# Patient Record
Sex: Male | Born: 1965 | Race: Black or African American | Hispanic: No | Marital: Single | Smoking: Current every day smoker
Health system: Southern US, Community
[De-identification: ages and names within clinical notes are randomized; demographics above are authoritative.]

## PROBLEM LIST (undated history)

## (undated) DIAGNOSIS — E7800 Pure hypercholesterolemia, unspecified: Secondary | ICD-10-CM

## (undated) DIAGNOSIS — J45909 Unspecified asthma, uncomplicated: Secondary | ICD-10-CM

## (undated) DIAGNOSIS — W3400XA Accidental discharge from unspecified firearms or gun, initial encounter: Secondary | ICD-10-CM

## (undated) DIAGNOSIS — E785 Hyperlipidemia, unspecified: Secondary | ICD-10-CM

## (undated) DIAGNOSIS — S21139A Puncture wound without foreign body of unspecified front wall of thorax without penetration into thoracic cavity, initial encounter: Secondary | ICD-10-CM

## (undated) HISTORY — PX: FRACTURE SURGERY: SHX138

## (undated) HISTORY — PX: MANDIBLE SURGERY: SHX707

---

## 2011-01-04 ENCOUNTER — Emergency Department (HOSPITAL_COMMUNITY): Payer: No Typology Code available for payment source

## 2011-01-04 ENCOUNTER — Emergency Department (HOSPITAL_COMMUNITY)
Admission: EM | Admit: 2011-01-04 | Discharge: 2011-01-05 | Disposition: A | Discharge status: Home / Self Care | Payer: No Typology Code available for payment source | Attending: Emergency Medicine | Admitting: Emergency Medicine

## 2011-01-04 DIAGNOSIS — S60229A Contusion of unspecified hand, initial encounter: Secondary | ICD-10-CM | POA: Insufficient documentation

## 2011-01-04 DIAGNOSIS — M79609 Pain in unspecified limb: Secondary | ICD-10-CM | POA: Insufficient documentation

## 2011-01-04 DIAGNOSIS — M7989 Other specified soft tissue disorders: Secondary | ICD-10-CM | POA: Insufficient documentation

## 2011-01-10 ENCOUNTER — Emergency Department (HOSPITAL_COMMUNITY): Payer: No Typology Code available for payment source

## 2011-01-10 ENCOUNTER — Emergency Department (HOSPITAL_COMMUNITY)
Admission: EM | Admit: 2011-01-10 | Discharge: 2011-01-10 | Disposition: A | Discharge status: Home / Self Care | Payer: No Typology Code available for payment source | Attending: Emergency Medicine | Admitting: Emergency Medicine

## 2011-01-10 DIAGNOSIS — Z09 Encounter for follow-up examination after completed treatment for conditions other than malignant neoplasm: Secondary | ICD-10-CM | POA: Insufficient documentation

## 2011-01-10 DIAGNOSIS — S60229A Contusion of unspecified hand, initial encounter: Secondary | ICD-10-CM | POA: Insufficient documentation

## 2011-01-10 DIAGNOSIS — M79609 Pain in unspecified limb: Secondary | ICD-10-CM | POA: Insufficient documentation

## 2011-01-10 DIAGNOSIS — M7989 Other specified soft tissue disorders: Secondary | ICD-10-CM | POA: Insufficient documentation

## 2011-01-10 DIAGNOSIS — S6990XA Unspecified injury of unspecified wrist, hand and finger(s), initial encounter: Secondary | ICD-10-CM | POA: Insufficient documentation

## 2011-05-09 ENCOUNTER — Encounter: Payer: Self-pay | Admitting: *Deleted

## 2011-05-09 ENCOUNTER — Emergency Department (HOSPITAL_COMMUNITY)
Admission: EM | Admit: 2011-05-09 | Discharge: 2011-05-10 | Disposition: A | Discharge status: Home / Self Care | Payer: Self-pay | Attending: Emergency Medicine | Admitting: Emergency Medicine

## 2011-05-09 DIAGNOSIS — J45901 Unspecified asthma with (acute) exacerbation: Secondary | ICD-10-CM | POA: Insufficient documentation

## 2011-05-09 DIAGNOSIS — R0789 Other chest pain: Secondary | ICD-10-CM | POA: Insufficient documentation

## 2011-05-09 DIAGNOSIS — R05 Cough: Secondary | ICD-10-CM | POA: Insufficient documentation

## 2011-05-09 DIAGNOSIS — R059 Cough, unspecified: Secondary | ICD-10-CM | POA: Insufficient documentation

## 2011-05-09 DIAGNOSIS — Z79899 Other long term (current) drug therapy: Secondary | ICD-10-CM | POA: Insufficient documentation

## 2011-05-09 DIAGNOSIS — E7800 Pure hypercholesterolemia, unspecified: Secondary | ICD-10-CM | POA: Insufficient documentation

## 2011-05-09 HISTORY — DX: Pure hypercholesterolemia, unspecified: E78.00

## 2011-05-09 HISTORY — DX: Accidental discharge from unspecified firearms or gun, initial encounter: W34.00XA

## 2011-05-09 HISTORY — DX: Puncture wound without foreign body of unspecified front wall of thorax without penetration into thoracic cavity, initial encounter: S21.139A

## 2011-05-09 MED ORDER — ALBUTEROL SULFATE (5 MG/ML) 0.5% IN NEBU
5.0000 mg | INHALATION_SOLUTION | Freq: Once | RESPIRATORY_TRACT | Status: AC
  Administered 2011-05-09: 5 mg via RESPIRATORY_TRACT
  Filled 2011-05-09: qty 1
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
## 2011-05-09 NOTE — ED Notes (Signed)
C/o cough, sob & wheezing. R/t asthma. Guarded resps & speech, easily provoked cough. LS inspiritory & expiritory wheezing. Productive with clear phlegm. Denies fever.

## 2011-05-10 ENCOUNTER — Emergency Department (HOSPITAL_COMMUNITY): Payer: Self-pay

## 2011-05-10 MED ORDER — ALBUTEROL SULFATE HFA 108 (90 BASE) MCG/ACT IN AERS
2.0000 | INHALATION_SPRAY | Freq: Once | RESPIRATORY_TRACT | Status: AC
  Administered 2011-05-10: 2 via RESPIRATORY_TRACT
  Filled 2011-05-10: qty 6.7

## 2011-05-10 MED ORDER — ALBUTEROL SULFATE HFA 108 (90 BASE) MCG/ACT IN AERS: 2.0000 | INHALATION_SPRAY | RESPIRATORY_TRACT | Status: DC | PRN

## 2011-05-10 MED ORDER — PREDNISONE 20 MG PO TABS: 20.0000 mg | ORAL_TABLET | Freq: Every day | ORAL | Status: AC
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
## 2011-08-08 ENCOUNTER — Emergency Department (HOSPITAL_COMMUNITY)
Admission: EM | Admit: 2011-08-08 | Discharge: 2011-08-08 | Disposition: A | Discharge status: Home / Self Care | Payer: Self-pay | Attending: Emergency Medicine | Admitting: Emergency Medicine

## 2011-08-08 ENCOUNTER — Encounter (HOSPITAL_COMMUNITY): Payer: Self-pay | Admitting: *Deleted

## 2011-08-08 DIAGNOSIS — IMO0002 Reserved for concepts with insufficient information to code with codable children: Secondary | ICD-10-CM | POA: Insufficient documentation

## 2011-08-08 DIAGNOSIS — F17200 Nicotine dependence, unspecified, uncomplicated: Secondary | ICD-10-CM | POA: Insufficient documentation

## 2011-08-08 DIAGNOSIS — Z79899 Other long term (current) drug therapy: Secondary | ICD-10-CM | POA: Insufficient documentation

## 2011-08-08 DIAGNOSIS — J45909 Unspecified asthma, uncomplicated: Secondary | ICD-10-CM | POA: Insufficient documentation

## 2011-08-08 DIAGNOSIS — M79609 Pain in unspecified limb: Secondary | ICD-10-CM | POA: Insufficient documentation

## 2011-08-08 DIAGNOSIS — E7800 Pure hypercholesterolemia, unspecified: Secondary | ICD-10-CM | POA: Insufficient documentation

## 2011-08-08 DIAGNOSIS — M7989 Other specified soft tissue disorders: Secondary | ICD-10-CM | POA: Insufficient documentation

## 2011-08-08 DIAGNOSIS — R609 Edema, unspecified: Secondary | ICD-10-CM | POA: Insufficient documentation

## 2011-08-08 MED ORDER — SULFAMETHOXAZOLE-TRIMETHOPRIM 800-160 MG PO TABS: 1.0000 | ORAL_TABLET | Freq: Two times a day (BID) | ORAL | Status: AC

## 2011-08-08 MED ORDER — HYDROCODONE-ACETAMINOPHEN 5-325 MG PO TABS: 1.0000 | ORAL_TABLET | ORAL | Status: AC | PRN

## 2011-08-08 NOTE — ED Notes (Signed)
Rt thumb nail infected he bites his fingernails and i has been painful and swollen for one week

## 2011-08-08 NOTE — ED Notes (Signed)
Patient rates pain 5/10; describes pain as "throbbing".

## 2011-08-08 NOTE — ED Notes (Addendum)
Patient complaining of infected right thumb nail for the past four days; patient states that he constantly bites his nails at work and that he has had hangnails in the past and he has soaked them in water to help with his pain.  Patient is worried that staff is going to cut his thumb off and keeps asking, "Are you going to cut my thumb? I need my hand for work and to pay rent".  Patient's right thumb swollen; white boil like area noted.  Girlfriend brought to bedside, per patient request.  Patient alert and oriented x4; PERRL present. Will continue to monitor.
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
## 2011-08-08 NOTE — ED Notes (Signed)
Patient given discharge paperwork; went over discharge instructions with patient.  Patient instructed to take Septra and Norco as directed; instructed to finish entire antibiotic prescription and to not drive while taking Norco.  Patient instructed to follow up with primary care physician and to return to the ED for new, worsening, or concerning symptoms.

## 2011-08-08 NOTE — ED Notes (Signed)
Patient currently sitting up in bed; no respiratory or acute distress noted.  Patient updated on plan of care; girlfriend at bedside.  Will continue to monitor.

## 2011-08-08 NOTE — ED Notes (Signed)
PA at bedside.

## 2011-08-09 NOTE — ED Provider Notes (Signed)
Medical screening examination/treatment/procedure(s) were performed by non-physician practitioner and as supervising physician I was immediately available for consultation/collaboration.  Ladene Allocca R. Noland Pizano, MD 08/09/11 2304 

## 2012-05-23 ENCOUNTER — Encounter (HOSPITAL_COMMUNITY): Payer: Self-pay

## 2012-05-23 ENCOUNTER — Emergency Department (HOSPITAL_COMMUNITY)
Admission: EM | Admit: 2012-05-23 | Discharge: 2012-05-23 | Disposition: A | Discharge status: Home / Self Care | Payer: Self-pay | Attending: Emergency Medicine | Admitting: Emergency Medicine

## 2012-05-23 DIAGNOSIS — Z202 Contact with and (suspected) exposure to infections with a predominantly sexual mode of transmission: Secondary | ICD-10-CM | POA: Insufficient documentation

## 2012-05-23 DIAGNOSIS — Z8709 Personal history of other diseases of the respiratory system: Secondary | ICD-10-CM | POA: Insufficient documentation

## 2012-05-23 DIAGNOSIS — J45909 Unspecified asthma, uncomplicated: Secondary | ICD-10-CM | POA: Insufficient documentation

## 2012-05-23 DIAGNOSIS — Z79899 Other long term (current) drug therapy: Secondary | ICD-10-CM | POA: Insufficient documentation

## 2012-05-23 DIAGNOSIS — F17200 Nicotine dependence, unspecified, uncomplicated: Secondary | ICD-10-CM | POA: Insufficient documentation

## 2012-05-23 DIAGNOSIS — E7800 Pure hypercholesterolemia, unspecified: Secondary | ICD-10-CM | POA: Insufficient documentation

## 2012-05-23 DIAGNOSIS — Z87828 Personal history of other (healed) physical injury and trauma: Secondary | ICD-10-CM | POA: Insufficient documentation

## 2012-05-23 MED ORDER — CEFTRIAXONE SODIUM 250 MG IJ SOLR
250.0000 mg | Freq: Once | INTRAMUSCULAR | Status: AC
  Administered 2012-05-23: 250 mg via INTRAMUSCULAR
  Filled 2012-05-23: qty 250

## 2012-05-23 MED ORDER — AZITHROMYCIN 250 MG PO TABS
1000.0000 mg | ORAL_TABLET | Freq: Once | ORAL | Status: AC
  Administered 2012-05-23: 1000 mg via ORAL
  Filled 2012-05-23: qty 4

## 2012-05-23 MED ORDER — CEFTRIAXONE SODIUM 1 G IJ SOLR: 1.0000 g | Freq: Once | INTRAMUSCULAR | Status: DC

## 2012-05-23 MED ORDER — LIDOCAINE HCL (PF) 1 % IJ SOLN
INTRAMUSCULAR | Status: AC
  Administered 2012-05-23: 0.9 mL
  Filled 2012-05-23: qty 5

## 2012-05-23 NOTE — ED Provider Notes (Signed)
Medical screening examination/treatment/procedure(s) were performed by non-physician practitioner and as supervising physician I was immediately available for consultation/collaboration.  Juliet Rude. Rubin Payor, MD 05/23/12 [phone]
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
## 2014-09-20 ENCOUNTER — Encounter (HOSPITAL_COMMUNITY): Payer: Self-pay | Admitting: *Deleted

## 2014-09-20 ENCOUNTER — Emergency Department (HOSPITAL_COMMUNITY)
Admission: EM | Admit: 2014-09-20 | Discharge: 2014-09-20 | Disposition: A | Discharge status: Home / Self Care | Payer: Self-pay | Attending: Emergency Medicine | Admitting: Emergency Medicine

## 2014-09-20 ENCOUNTER — Emergency Department (HOSPITAL_COMMUNITY): Payer: Self-pay

## 2014-09-20 DIAGNOSIS — E780 Pure hypercholesterolemia: Secondary | ICD-10-CM | POA: Insufficient documentation

## 2014-09-20 DIAGNOSIS — S62619A Displaced fracture of proximal phalanx of unspecified finger, initial encounter for closed fracture: Secondary | ICD-10-CM

## 2014-09-20 DIAGNOSIS — Z79899 Other long term (current) drug therapy: Secondary | ICD-10-CM | POA: Insufficient documentation

## 2014-09-20 DIAGNOSIS — Y998 Other external cause status: Secondary | ICD-10-CM | POA: Insufficient documentation

## 2014-09-20 DIAGNOSIS — S0081XA Abrasion of other part of head, initial encounter: Secondary | ICD-10-CM | POA: Insufficient documentation

## 2014-09-20 DIAGNOSIS — S62623A Displaced fracture of medial phalanx of left middle finger, initial encounter for closed fracture: Secondary | ICD-10-CM | POA: Insufficient documentation

## 2014-09-20 DIAGNOSIS — Z23 Encounter for immunization: Secondary | ICD-10-CM | POA: Insufficient documentation

## 2014-09-20 DIAGNOSIS — Y9389 Activity, other specified: Secondary | ICD-10-CM | POA: Insufficient documentation

## 2014-09-20 DIAGNOSIS — J45909 Unspecified asthma, uncomplicated: Secondary | ICD-10-CM | POA: Insufficient documentation

## 2014-09-20 DIAGNOSIS — Y92410 Unspecified street and highway as the place of occurrence of the external cause: Secondary | ICD-10-CM | POA: Insufficient documentation

## 2014-09-20 DIAGNOSIS — Z720 Tobacco use: Secondary | ICD-10-CM | POA: Insufficient documentation

## 2014-09-20 MED ORDER — IBUPROFEN 400 MG PO TABS
600.0000 mg | ORAL_TABLET | Freq: Once | ORAL | Status: AC
  Administered 2014-09-20: 600 mg via ORAL
  Filled 2014-09-20 (×2): qty 1

## 2014-09-20 MED ORDER — TETANUS-DIPHTH-ACELL PERTUSSIS 5-2.5-18.5 LF-MCG/0.5 IM SUSP
0.5000 mL | Freq: Once | INTRAMUSCULAR | Status: AC
  Administered 2014-09-20: 0.5 mL via INTRAMUSCULAR
  Filled 2014-09-20: qty 0.5

## 2014-09-20 NOTE — Progress Notes (Signed)
Orthopedic Tech Progress Note Patient Details:  Rickey Kelly 1966-03-25 [id number] Applied fiberglass short arm splint to palmer side of LUE.  Extended splint up palm to pip joint to immobilize third finger, per verbal order of ED MD.  Positioned hand and fingers in position of use.  Pulses, sensation, motion intact before and after splinting.  Capillary refill less than 2 seconds before and after splinting. Ortho Devices Type of Ortho Device: Other (comment) Ortho Device/Splint Location: Palmer-sided short arm splint, LUE.   Lesle ChrisGilliland, Makaelyn Aponte L 09/20/2014, 9:45 PM

## 2014-09-20 NOTE — ED Notes (Signed)
Ortho paged. 
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
## 2014-09-20 NOTE — Discharge Instructions (Signed)
Cast or Splint Care °Casts and splints support injured limbs and keep bones from moving while they heal. It is important to care for your cast or splint at home.   °HOME CARE INSTRUCTIONS °· Keep the cast or splint uncovered during the drying period. It can take 24 to 48 hours to dry if it is made of plaster. A fiberglass cast will dry in less than 1 hour. °· Do not rest the cast on anything harder than a pillow for the first 24 hours. °· Do not put weight on your injured limb or apply pressure to the cast until your health care provider gives you permission. °· Keep the cast or splint dry. Wet casts or splints can lose their shape and may not support the limb as well. A wet cast that has lost its shape can also create harmful pressure on your skin when it dries. Also, wet skin can become infected. °· Cover the cast or splint with a plastic bag when bathing or when out in the rain or snow. If the cast is on the trunk of the body, take sponge baths until the cast is removed. °· If your cast does become wet, dry it with a towel or a blow dryer on the cool setting only. °· Keep your cast or splint clean. Soiled casts may be wiped with a moistened cloth. °· Do not place any hard or soft foreign objects under your cast or splint, such as cotton, toilet paper, lotion, or powder. °· Do not try to scratch the skin under the cast with any object. The object could get stuck inside the cast. Also, scratching could lead to an infection. If itching is a problem, use a blow dryer on a cool setting to relieve discomfort. °· Do not trim or cut your cast or remove padding from inside of it. °· Exercise all joints next to the injury that are not immobilized by the cast or splint. For example, if you have a long leg cast, exercise the hip joint and toes. If you have an arm cast or splint, exercise the shoulder, elbow, thumb, and fingers. °· Elevate your injured arm or leg on 1 or 2 pillows for the first 1 to 3 days to decrease  swelling and pain. It is best if you can comfortably elevate your cast so it is higher than your heart. °SEEK MEDICAL CARE IF:  °· Your cast or splint cracks. °· Your cast or splint is too tight or too loose. °· You have unbearable itching inside the cast. °· Your cast becomes wet or develops a soft spot or area. °· You have a bad smell coming from inside your cast. °· You get an object stuck under your cast. °· Your skin around the cast becomes red or raw. °· You have new pain or worsening pain after the cast has been applied. °SEEK IMMEDIATE MEDICAL CARE IF:  °· You have fluid leaking through the cast. °· You are unable to move your fingers or toes. °· You have discolored (blue or white), cool, painful, or very swollen fingers or toes beyond the cast. °· You have tingling or numbness around the injured area. °· You have severe pain or pressure under the cast. °· You have any difficulty with your breathing or have shortness of breath. °· You have chest pain. °Document Released: 04/15/2000 Document Revised: 02/06/2013 Document Reviewed: 10/25/2012 °Exit[employer]® Patient Information ©2015 [employer]. This information is not intended to replace advice given to you by your health care   provider. Make sure you discuss any questions you have with your health care provider.  Motor Vehicle Collision It is common to have multiple bruises and sore muscles after a motor vehicle collision (MVC). These tend to feel worse for the first 24 hours. You may have the most stiffness and soreness over the first several hours. You may also feel worse when you wake up the first morning after your collision. After this point, you will usually begin to improve with each day. The speed of improvement often depends on the severity of the collision, the number of injuries, and the location and nature of these injuries. HOME CARE INSTRUCTIONS  Put ice on the injured area.  Put ice in a plastic bag.  Place a towel between your skin and  the bag.  Leave the ice on for 15-20 minutes, 3-4 times a day, or as directed by your health care provider.  Drink enough fluids to keep your urine clear or pale yellow. Do not drink alcohol.  Take a warm shower or bath once or twice a day. This will increase blood flow to sore muscles.  You may return to activities as directed by your caregiver. Be careful when lifting, as this may aggravate neck or back pain.  Only take over-the-counter or prescription medicines for pain, discomfort, or fever as directed by your caregiver. Do not use aspirin. This may increase bruising and bleeding. SEEK IMMEDIATE MEDICAL CARE IF:  You have numbness, tingling, or weakness in the arms or legs.  You develop severe headaches not relieved with medicine.  You have severe neck pain, especially tenderness in the middle of the back of your neck.  You have changes in bowel or bladder control.  There is increasing pain in any area of the body.  You have shortness of breath, light-headedness, dizziness, or fainting.  You have chest pain.  You feel sick to your stomach (nauseous), throw up (vomit), or sweat.  You have increasing abdominal discomfort.  There is blood in your urine, stool, or vomit.  You have pain in your shoulder (shoulder strap areas).  You feel your symptoms are getting worse. MAKE SURE YOU:  Understand these instructions.  Will watch your condition.  Will get help right away if you are not doing well or get worse. Document Released: 04/18/2005 Document Revised: 09/02/2013 Document Reviewed: 09/15/2010 [facility] Patient Information 2015 [place], [place]. This information is not intended to replace advice given to you by your health care provider. Make sure you discuss any questions you have with your health care provider.  Finger Fracture Fractures of fingers are breaks in the bones of the fingers. There are many types of fractures. There are different ways of treating these  fractures. Your health care provider will discuss the best way to treat your fracture. CAUSES Traumatic injury is the main cause of broken fingers. These include:  Injuries while playing sports.  Workplace injuries.  Falls. RISK FACTORS Activities that can increase your risk of finger fractures include:  Sports.  Workplace activities that involve machinery.  A condition called osteoporosis, which can make your bones less dense and cause them to fracture more easily. SIGNS AND SYMPTOMS The main symptoms of a broken finger are pain and swelling within 15 minutes after the injury. Other symptoms include:  Bruising of your finger.  Stiffness of your finger.  Numbness of your finger.  Exposed bones (compound fracture) if the fracture is severe. DIAGNOSIS  The best way to diagnose a broken bone is  with X-ray imaging. Additionally, your health care provider will use this X-ray image to evaluate the position of the broken finger bones.  TREATMENT  Finger fractures can be treated with:   Nonreduction--This means the bones are in place. The finger is splinted without changing the positions of the bone pieces. The splint is usually left on for about a week to 10 days. This will depend on your fracture and what your health care provider thinks.  Closed reduction--The bones are put back into position without using surgery. The finger is then splinted.  Open reduction and internal fixation--The fracture site is opened. Then the bone pieces are fixed into place with pins or some type of hardware. This is seldom required. It depends on the severity of the fracture. HOME CARE INSTRUCTIONS   Follow your health care provider's instructions regarding activities, exercises, and physical therapy.  Only take over-the-counter or prescription medicines for pain, discomfort, or fever as directed by your health care provider. SEEK MEDICAL CARE IF: You have pain or swelling that limits the motion or  use of your fingers. SEEK IMMEDIATE MEDICAL CARE IF:  Your finger becomes numb. MAKE SURE YOU:   Understand these instructions.  Will watch your condition.  Will get help right away if you are not doing well or get worse. Document Released: 07/31/2000 Document Revised: 02/06/2013 Document Reviewed: 11/28/2012 [facility] Patient Information 2015 [place]. This information is not intended to replace advice given to you by your health care provider. Make sure you discuss any questions you have with your health care provider.
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
## 2014-10-15 ENCOUNTER — Emergency Department (HOSPITAL_COMMUNITY)
Admission: EM | Admit: 2014-10-15 | Discharge: 2014-10-15 | Disposition: A | Discharge status: Home / Self Care | Payer: Self-pay | Attending: Emergency Medicine | Admitting: Emergency Medicine

## 2014-10-15 ENCOUNTER — Emergency Department (HOSPITAL_COMMUNITY): Payer: Self-pay

## 2014-10-15 ENCOUNTER — Encounter (HOSPITAL_COMMUNITY): Payer: Self-pay | Admitting: Family Medicine

## 2014-10-15 DIAGNOSIS — E780 Pure hypercholesterolemia: Secondary | ICD-10-CM | POA: Insufficient documentation

## 2014-10-15 DIAGNOSIS — Z79899 Other long term (current) drug therapy: Secondary | ICD-10-CM | POA: Insufficient documentation

## 2014-10-15 DIAGNOSIS — S62308P Unspecified fracture of other metacarpal bone, subsequent encounter for fracture with malunion: Secondary | ICD-10-CM

## 2014-10-15 DIAGNOSIS — R200 Anesthesia of skin: Secondary | ICD-10-CM | POA: Insufficient documentation

## 2014-10-15 DIAGNOSIS — J45909 Unspecified asthma, uncomplicated: Secondary | ICD-10-CM | POA: Insufficient documentation

## 2014-10-15 DIAGNOSIS — Z720 Tobacco use: Secondary | ICD-10-CM | POA: Insufficient documentation

## 2014-10-15 DIAGNOSIS — S62303D Unspecified fracture of third metacarpal bone, left hand, subsequent encounter for fracture with routine healing: Secondary | ICD-10-CM | POA: Insufficient documentation

## 2014-10-15 MED ORDER — HYDROCODONE-ACETAMINOPHEN 5-325 MG PO TABS: 2.0000 | ORAL_TABLET | ORAL | Status: DC | PRN

## 2014-10-15 NOTE — ED Notes (Signed)
Ortho called and returned page.

## 2014-10-15 NOTE — Discharge Instructions (Signed)
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
## 2014-10-15 NOTE — Progress Notes (Signed)
Orthopedic Tech Progress Note Patient Details:  Rickey Kelly June 12, 1965 [id number]  Ortho Devices Type of Ortho Device: Ace wrap, Finger splint Ortho Device/Splint Location: lue Ortho Device/Splint Interventions: Application   Gardiner Espana 10/15/2014, 12:29 PM

## 2015-02-15 ENCOUNTER — Emergency Department (HOSPITAL_COMMUNITY)
Admission: EM | Admit: 2015-02-15 | Discharge: 2015-02-15 | Disposition: A | Discharge status: Home / Self Care | Payer: Self-pay | Attending: Emergency Medicine | Admitting: Emergency Medicine

## 2015-02-15 ENCOUNTER — Encounter (HOSPITAL_COMMUNITY): Payer: Self-pay | Admitting: Emergency Medicine

## 2015-02-15 ENCOUNTER — Emergency Department (HOSPITAL_COMMUNITY): Payer: Self-pay

## 2015-02-15 DIAGNOSIS — Z720 Tobacco use: Secondary | ICD-10-CM | POA: Insufficient documentation

## 2015-02-15 DIAGNOSIS — Z8639 Personal history of other endocrine, nutritional and metabolic disease: Secondary | ICD-10-CM | POA: Insufficient documentation

## 2015-02-15 DIAGNOSIS — Z87828 Personal history of other (healed) physical injury and trauma: Secondary | ICD-10-CM | POA: Insufficient documentation

## 2015-02-15 DIAGNOSIS — J45901 Unspecified asthma with (acute) exacerbation: Secondary | ICD-10-CM | POA: Insufficient documentation

## 2015-02-15 LAB — CBC
HCT: 46.2 % (ref 39.0–52.0)
HEMOGLOBIN: 16 g/dL (ref 13.0–17.0)
MCH: 34.6 pg — AB (ref 26.0–34.0)
MCHC: 34.6 g/dL (ref 30.0–36.0)
MCV: 100 fL (ref 78.0–100.0)
PLATELETS: 274 10*3/uL (ref 150–400)
RBC: 4.62 MIL/uL (ref 4.22–5.81)
RDW: 13 % (ref 11.5–15.5)
WBC: 5.6 10*3/uL (ref 4.0–10.5)

## 2015-02-15 LAB — I-STAT TROPONIN, ED: TROPONIN I, POC: 0 ng/mL (ref 0.00–0.08)

## 2015-02-15 LAB — BASIC METABOLIC PANEL
ANION GAP: 10 (ref 5–15)
BUN: 15 mg/dL (ref 6–20)
CALCIUM: 9.8 mg/dL (ref 8.9–10.3)
CO2: 28 mmol/L (ref 22–32)
CREATININE: 0.99 mg/dL (ref 0.61–1.24)
Chloride: 102 mmol/L (ref 101–111)
Glucose, Bld: 141 mg/dL — ABNORMAL HIGH (ref 65–99)
Potassium: 4.4 mmol/L (ref 3.5–5.1)
SODIUM: 140 mmol/L (ref 135–145)

## 2015-02-15 MED ORDER — ALBUTEROL SULFATE (2.5 MG/3ML) 0.083% IN NEBU
INHALATION_SOLUTION | RESPIRATORY_TRACT | Status: AC
  Filled 2015-02-15: qty 6

## 2015-02-15 MED ORDER — ALBUTEROL SULFATE HFA 108 (90 BASE) MCG/ACT IN AERS: 2.0000 | INHALATION_SPRAY | Freq: Four times a day (QID) | RESPIRATORY_TRACT | Status: DC | PRN

## 2015-02-15 MED ORDER — PREDNISONE 20 MG PO TABS: 40.0000 mg | ORAL_TABLET | Freq: Every day | ORAL | Status: DC

## 2015-02-15 MED ORDER — ALBUTEROL SULFATE (2.5 MG/3ML) 0.083% IN NEBU
5.0000 mg | INHALATION_SOLUTION | Freq: Once | RESPIRATORY_TRACT | Status: AC
  Administered 2015-02-15: 5 mg via RESPIRATORY_TRACT

## 2015-02-15 MED ORDER — IPRATROPIUM-ALBUTEROL 0.5-2.5 (3) MG/3ML IN SOLN
3.0000 mL | RESPIRATORY_TRACT | Status: AC
  Administered 2015-02-15: 3 mL via RESPIRATORY_TRACT
  Filled 2015-02-15: qty 3

## 2015-02-15 MED ORDER — ALBUTEROL SULFATE HFA 108 (90 BASE) MCG/ACT IN AERS
2.0000 | INHALATION_SPRAY | Freq: Once | RESPIRATORY_TRACT | Status: AC
  Administered 2015-02-15: 2 via RESPIRATORY_TRACT
  Filled 2015-02-15: qty 6.7

## 2015-02-15 MED ORDER — PREDNISONE 20 MG PO TABS
40.0000 mg | ORAL_TABLET | Freq: Once | ORAL | Status: AC
  Administered 2015-02-15: 40 mg via ORAL
  Filled 2015-02-15: qty 2
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
## 2015-02-15 NOTE — ED Notes (Signed)
Patient here with complaint of asthma exacerbation. 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 Currently wheezing inspiratory and expiratory. Hx of severe asthma with hospitalization and intubation; also history of "collapsed lung on right".
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
## 2016-02-23 ENCOUNTER — Encounter (HOSPITAL_COMMUNITY): Payer: Self-pay | Admitting: *Deleted

## 2016-02-23 ENCOUNTER — Ambulatory Visit (HOSPITAL_COMMUNITY)
Admission: EM | Admit: 2016-02-23 | Discharge: 2016-02-23 | Disposition: A | Discharge status: Home / Self Care | Payer: Self-pay | Attending: Family Medicine | Admitting: Family Medicine

## 2016-02-23 DIAGNOSIS — J4521 Mild intermittent asthma with (acute) exacerbation: Secondary | ICD-10-CM

## 2016-02-23 DIAGNOSIS — Z87891 Personal history of nicotine dependence: Secondary | ICD-10-CM

## 2016-02-23 MED ORDER — PREDNISONE 50 MG PO TABS: ORAL_TABLET | ORAL | 0 refills | Status: DC

## 2016-02-23 MED ORDER — TRIAMCINOLONE ACETONIDE 40 MG/ML IJ SUSP
40.0000 mg | Freq: Once | INTRAMUSCULAR | Status: AC
  Administered 2016-02-23: 40 mg via INTRAMUSCULAR

## 2016-02-23 MED ORDER — TRIAMCINOLONE ACETONIDE 40 MG/ML IJ SUSP
INTRAMUSCULAR | Status: AC
  Filled 2016-02-23: qty 1

## 2016-02-23 MED ORDER — IPRATROPIUM-ALBUTEROL 0.5-2.5 (3) MG/3ML IN SOLN
3.0000 mL | Freq: Once | RESPIRATORY_TRACT | Status: AC
  Administered 2016-02-23: 3 mL via RESPIRATORY_TRACT

## 2016-02-23 MED ORDER — IPRATROPIUM-ALBUTEROL 0.5-2.5 (3) MG/3ML IN SOLN
RESPIRATORY_TRACT | Status: AC
  Filled 2016-02-23: qty 3

## 2016-02-23 NOTE — ED Triage Notes (Signed)
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 Stated he used his rescue inhaler last night. States still feeling mildly sob today. Speaks in complete sentences.

## 2016-02-23 NOTE — Discharge Instructions (Signed)
Use inhalers and nebulizers as directed and needed. Start taking the prednisone tomorrow since she had her injection today. Take medicine with food. Follow-up with primary care doctor as needed. Stop smoking.
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
## 2016-05-12 ENCOUNTER — Encounter (HOSPITAL_COMMUNITY): Payer: Self-pay | Admitting: Family Medicine

## 2016-05-12 ENCOUNTER — Ambulatory Visit (HOSPITAL_COMMUNITY)
Admission: EM | Admit: 2016-05-12 | Discharge: 2016-05-12 | Disposition: A | Discharge status: Home / Self Care | Payer: Self-pay | Attending: Emergency Medicine | Admitting: Emergency Medicine

## 2016-05-12 DIAGNOSIS — S39012A Strain of muscle, fascia and tendon of lower back, initial encounter: Secondary | ICD-10-CM

## 2016-05-12 MED ORDER — CYCLOBENZAPRINE HCL 10 MG PO TABS: 10.0000 mg | ORAL_TABLET | Freq: Two times a day (BID) | ORAL | 0 refills | Status: DC | PRN

## 2016-05-12 MED ORDER — KETOROLAC TROMETHAMINE 60 MG/2ML IM SOLN
INTRAMUSCULAR | Status: AC
Start: 2016-05-12 — End: 2016-05-12
  Filled 2016-05-12: qty 2

## 2016-05-12 MED ORDER — KETOROLAC TROMETHAMINE 60 MG/2ML IM SOLN
60.0000 mg | Freq: Once | INTRAMUSCULAR | Status: AC
  Administered 2016-05-12: 60 mg via INTRAMUSCULAR

## 2016-05-12 MED ORDER — NAPROXEN 500 MG PO TABS: 500.0000 mg | ORAL_TABLET | Freq: Two times a day (BID) | ORAL | 0 refills | Status: DC

## 2016-05-12 NOTE — ED Triage Notes (Signed)
Pt here for lower back pain more on the left side. sts he feels like a knot is in his lower back. 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

## 2016-05-12 NOTE — ED Notes (Signed)
Reviewed instructions and script x 2 -hard copy and work note
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
## 2016-12-23 ENCOUNTER — Emergency Department (HOSPITAL_COMMUNITY)
Admission: EM | Admit: 2016-12-23 | Discharge: 2016-12-23 | Disposition: A | Discharge status: Home / Self Care | Payer: Self-pay | Attending: Emergency Medicine | Admitting: Emergency Medicine

## 2016-12-23 ENCOUNTER — Encounter (HOSPITAL_COMMUNITY): Payer: Self-pay | Admitting: Emergency Medicine

## 2016-12-23 DIAGNOSIS — R600 Localized edema: Secondary | ICD-10-CM | POA: Insufficient documentation

## 2016-12-23 DIAGNOSIS — F17200 Nicotine dependence, unspecified, uncomplicated: Secondary | ICD-10-CM | POA: Insufficient documentation

## 2016-12-23 DIAGNOSIS — T7840XA Allergy, unspecified, initial encounter: Secondary | ICD-10-CM | POA: Insufficient documentation

## 2016-12-23 DIAGNOSIS — R220 Localized swelling, mass and lump, head: Secondary | ICD-10-CM

## 2016-12-23 DIAGNOSIS — J45909 Unspecified asthma, uncomplicated: Secondary | ICD-10-CM | POA: Insufficient documentation

## 2016-12-23 DIAGNOSIS — E7800 Pure hypercholesterolemia, unspecified: Secondary | ICD-10-CM | POA: Insufficient documentation

## 2016-12-23 LAB — URINALYSIS, ROUTINE W REFLEX MICROSCOPIC
BILIRUBIN URINE: NEGATIVE
GLUCOSE, UA: NEGATIVE mg/dL
KETONES UR: NEGATIVE mg/dL
Leukocytes, UA: NEGATIVE
Nitrite: NEGATIVE
PH: 5 (ref 5.0–8.0)
PROTEIN: NEGATIVE mg/dL
Specific Gravity, Urine: 1.019 (ref 1.005–1.030)

## 2016-12-23 LAB — CBC WITH DIFFERENTIAL/PLATELET
BASOS ABS: 0 10*3/uL (ref 0.0–0.1)
Basophils Relative: 0 %
Eosinophils Absolute: 0.1 10*3/uL (ref 0.0–0.7)
Eosinophils Relative: 2 %
HCT: 47.3 % (ref 39.0–52.0)
HEMOGLOBIN: 15.9 g/dL (ref 13.0–17.0)
LYMPHS ABS: 3.1 10*3/uL (ref 0.7–4.0)
Lymphocytes Relative: 45 %
MCH: 32.7 pg (ref 26.0–34.0)
MCHC: 33.6 g/dL (ref 30.0–36.0)
MCV: 97.3 fL (ref 78.0–100.0)
MONO ABS: 0.2 10*3/uL (ref 0.1–1.0)
MONOS PCT: 3 %
NEUTROS PCT: 50 %
Neutro Abs: 3.5 10*3/uL (ref 1.7–7.7)
PLATELETS: 302 10*3/uL (ref 150–400)
RBC: 4.86 MIL/uL (ref 4.22–5.81)
RDW: 13.5 % (ref 11.5–15.5)
WBC: 6.9 10*3/uL (ref 4.0–10.5)

## 2016-12-23 LAB — BASIC METABOLIC PANEL
ANION GAP: 11 (ref 5–15)
BUN: 12 mg/dL (ref 6–20)
CHLORIDE: 103 mmol/L (ref 101–111)
CO2: 25 mmol/L (ref 22–32)
Calcium: 9.5 mg/dL (ref 8.9–10.3)
Creatinine, Ser: 0.99 mg/dL (ref 0.61–1.24)
GFR calc Af Amer: >60 mL/min (ref >60)
Glucose, Bld: 118 mg/dL — ABNORMAL HIGH (ref 65–99)
POTASSIUM: 3.8 mmol/L (ref 3.5–5.1)
SODIUM: 139 mmol/L (ref 135–145)

## 2016-12-23 MED ORDER — METHYLPREDNISOLONE SODIUM SUCC 125 MG IJ SOLR
125.0000 mg | Freq: Once | INTRAMUSCULAR | Status: AC
  Administered 2016-12-23: 125 mg via INTRAVENOUS
  Filled 2016-12-23: qty 2

## 2016-12-23 MED ORDER — ONDANSETRON HCL 4 MG/2ML IJ SOLN
INTRAMUSCULAR | Status: AC
  Filled 2016-12-23: qty 2

## 2016-12-23 MED ORDER — EPINEPHRINE 0.3 MG/0.3ML IJ SOAJ
0.3000 mg | Freq: Once | INTRAMUSCULAR | Status: AC
  Administered 2016-12-23: 0.3 mg via INTRAMUSCULAR
  Filled 2016-12-23: qty 0.3

## 2016-12-23 MED ORDER — PREDNISONE 10 MG PO TABS: 20.0000 mg | ORAL_TABLET | Freq: Every day | ORAL | 0 refills | Status: AC

## 2016-12-23 MED ORDER — EPINEPHRINE 0.3 MG/0.3ML IJ SOAJ: 0.3000 mg | Freq: Once | INTRAMUSCULAR | 0 refills | Status: AC

## 2016-12-23 MED ORDER — FAMOTIDINE IN NACL 20-0.9 MG/50ML-% IV SOLN
20.0000 mg | Freq: Once | INTRAVENOUS | Status: AC
  Administered 2016-12-23: 20 mg via INTRAVENOUS
  Filled 2016-12-23: qty 50

## 2016-12-23 MED ORDER — ONDANSETRON HCL 4 MG/2ML IJ SOLN: 4.0000 mg | Freq: Once | INTRAMUSCULAR | Status: DC

## 2016-12-23 MED ORDER — FAMOTIDINE 40 MG PO TABS: 40.0000 mg | ORAL_TABLET | Freq: Every day | ORAL | 0 refills | Status: AC

## 2016-12-23 MED ORDER — DIPHENHYDRAMINE HCL 50 MG/ML IJ SOLN
50.0000 mg | Freq: Once | INTRAMUSCULAR | Status: AC
  Administered 2016-12-23: 50 mg via INTRAVENOUS
  Filled 2016-12-23: qty 1
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
## 2016-12-23 NOTE — Discharge Instructions (Signed)

## 2016-12-23 NOTE — ED Triage Notes (Signed)
Pt woke up today with lip swollen, pt is having difficulty swallowing at this point, no different food, medication gotten by the pt.

## 2017-01-26 ENCOUNTER — Emergency Department (HOSPITAL_COMMUNITY)
Admission: EM | Admit: 2017-01-26 | Discharge: 2017-01-27 | Disposition: A | Discharge status: Home / Self Care | Payer: Self-pay | Attending: Physician Assistant | Admitting: Physician Assistant

## 2017-01-26 ENCOUNTER — Encounter (HOSPITAL_COMMUNITY): Payer: Self-pay

## 2017-01-26 ENCOUNTER — Emergency Department (HOSPITAL_COMMUNITY): Payer: Self-pay

## 2017-01-26 DIAGNOSIS — J45901 Unspecified asthma with (acute) exacerbation: Secondary | ICD-10-CM | POA: Insufficient documentation

## 2017-01-26 DIAGNOSIS — Z79891 Long term (current) use of opiate analgesic: Secondary | ICD-10-CM | POA: Insufficient documentation

## 2017-01-26 DIAGNOSIS — R509 Fever, unspecified: Secondary | ICD-10-CM | POA: Insufficient documentation

## 2017-01-26 DIAGNOSIS — F17210 Nicotine dependence, cigarettes, uncomplicated: Secondary | ICD-10-CM | POA: Insufficient documentation

## 2017-01-26 DIAGNOSIS — Z79899 Other long term (current) drug therapy: Secondary | ICD-10-CM | POA: Insufficient documentation

## 2017-01-26 DIAGNOSIS — J40 Bronchitis, not specified as acute or chronic: Secondary | ICD-10-CM

## 2017-01-26 MED ORDER — IPRATROPIUM-ALBUTEROL 0.5-2.5 (3) MG/3ML IN SOLN
RESPIRATORY_TRACT | Status: AC
  Filled 2017-01-26: qty 3

## 2017-01-26 MED ORDER — ALBUTEROL SULFATE HFA 108 (90 BASE) MCG/ACT IN AERS: 2.0000 | INHALATION_SPRAY | RESPIRATORY_TRACT | 0 refills | Status: DC | PRN

## 2017-01-26 MED ORDER — PREDNISONE 20 MG PO TABS
60.0000 mg | ORAL_TABLET | Freq: Every day | ORAL | Status: DC
  Filled 2017-01-26: qty 3

## 2017-01-26 MED ORDER — ALBUTEROL SULFATE (2.5 MG/3ML) 0.083% IN NEBU
INHALATION_SOLUTION | RESPIRATORY_TRACT | Status: AC
  Filled 2017-01-26: qty 3

## 2017-01-26 MED ORDER — AMOXICILLIN 500 MG PO CAPS: 500.0000 mg | ORAL_CAPSULE | Freq: Three times a day (TID) | ORAL | 0 refills | Status: DC

## 2017-01-26 MED ORDER — ALBUTEROL SULFATE (2.5 MG/3ML) 0.083% IN NEBU
5.0000 mg | INHALATION_SOLUTION | Freq: Once | RESPIRATORY_TRACT | Status: AC
  Administered 2017-01-26: 5 mg via RESPIRATORY_TRACT

## 2017-01-26 MED ORDER — PREDNISONE 10 MG (21) PO TBPK: ORAL_TABLET | ORAL | 0 refills | Status: DC

## 2017-01-26 MED ORDER — ALBUTEROL SULFATE HFA 108 (90 BASE) MCG/ACT IN AERS
2.0000 | INHALATION_SPRAY | RESPIRATORY_TRACT | Status: DC
  Administered 2017-01-26: 2 via RESPIRATORY_TRACT
  Filled 2017-01-26: qty 6.7

## 2017-01-26 MED ORDER — IPRATROPIUM-ALBUTEROL 0.5-2.5 (3) MG/3ML IN SOLN
3.0000 mL | Freq: Once | RESPIRATORY_TRACT | Status: AC
  Administered 2017-01-26: 3 mL via RESPIRATORY_TRACT
  Filled 2017-01-26: qty 3

## 2017-01-26 NOTE — ED Notes (Addendum)
Pt called no answer 

## 2017-01-26 NOTE — ED Triage Notes (Signed)
Pt presents with 2-3 day h/o shortness of breath, productive cough; albuterol inhaler not working.

## 2017-01-26 NOTE — ED Notes (Signed)
Pt returned from outside
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
## 2017-01-27 NOTE — ED Notes (Signed)
PT states understanding of care given, follow up care, and medication prescribed. PT ambulated from ED to car with a steady gait. 

## 2017-04-05 ENCOUNTER — Encounter (HOSPITAL_COMMUNITY): Payer: Self-pay | Admitting: Emergency Medicine

## 2017-04-05 ENCOUNTER — Emergency Department (HOSPITAL_COMMUNITY): Payer: Self-pay

## 2017-04-05 ENCOUNTER — Emergency Department (HOSPITAL_COMMUNITY)
Admission: EM | Admit: 2017-04-05 | Discharge: 2017-04-06 | Disposition: A | Discharge status: Home / Self Care | Payer: Self-pay | Attending: Emergency Medicine | Admitting: Emergency Medicine

## 2017-04-05 DIAGNOSIS — F17210 Nicotine dependence, cigarettes, uncomplicated: Secondary | ICD-10-CM | POA: Insufficient documentation

## 2017-04-05 DIAGNOSIS — R61 Generalized hyperhidrosis: Secondary | ICD-10-CM | POA: Insufficient documentation

## 2017-04-05 DIAGNOSIS — Z79899 Other long term (current) drug therapy: Secondary | ICD-10-CM | POA: Insufficient documentation

## 2017-04-05 DIAGNOSIS — J4521 Mild intermittent asthma with (acute) exacerbation: Secondary | ICD-10-CM | POA: Insufficient documentation

## 2017-04-05 DIAGNOSIS — R0602 Shortness of breath: Secondary | ICD-10-CM | POA: Insufficient documentation

## 2017-04-05 LAB — CBC WITH DIFFERENTIAL/PLATELET
BASOS PCT: 0 %
Basophils Absolute: 0 10*3/uL (ref 0.0–0.1)
Eosinophils Absolute: 0.5 10*3/uL (ref 0.0–0.7)
Eosinophils Relative: 4 %
HCT: 46.8 % (ref 39.0–52.0)
HEMOGLOBIN: 16 g/dL (ref 13.0–17.0)
LYMPHS ABS: 3.3 10*3/uL (ref 0.7–4.0)
Lymphocytes Relative: 27 %
MCH: 33.8 pg (ref 26.0–34.0)
MCHC: 34.2 g/dL (ref 30.0–36.0)
MCV: 98.9 fL (ref 78.0–100.0)
MONO ABS: 0.8 10*3/uL (ref 0.1–1.0)
MONOS PCT: 6 %
NEUTROS ABS: 7.7 10*3/uL (ref 1.7–7.7)
Neutrophils Relative %: 63 %
Platelets: 296 10*3/uL (ref 150–400)
RBC: 4.73 MIL/uL (ref 4.22–5.81)
RDW: 12.6 % (ref 11.5–15.5)
WBC: 12.3 10*3/uL — ABNORMAL HIGH (ref 4.0–10.5)

## 2017-04-05 LAB — I-STAT TROPONIN, ED: Troponin i, poc: 0.01 ng/mL (ref 0.00–0.08)

## 2017-04-05 LAB — BASIC METABOLIC PANEL
ANION GAP: 5 (ref 5–15)
BUN: 16 mg/dL (ref 6–20)
CALCIUM: 9.6 mg/dL (ref 8.9–10.3)
CHLORIDE: 106 mmol/L (ref 101–111)
CO2: 30 mmol/L (ref 22–32)
Creatinine, Ser: 1.22 mg/dL (ref 0.61–1.24)
GFR calc Af Amer: >60 mL/min (ref >60)
GFR calc non Af Amer: >60 mL/min (ref >60)
GLUCOSE: 122 mg/dL — AB (ref 65–99)
Potassium: 4.1 mmol/L (ref 3.5–5.1)
Sodium: 141 mmol/L (ref 135–145)

## 2017-04-05 MED ORDER — ALBUTEROL SULFATE HFA 108 (90 BASE) MCG/ACT IN AERS
2.0000 | INHALATION_SPRAY | RESPIRATORY_TRACT | Status: DC | PRN
  Administered 2017-04-06: 2 via RESPIRATORY_TRACT
  Filled 2017-04-05: qty 6.7

## 2017-04-05 MED ORDER — ALBUTEROL SULFATE HFA 108 (90 BASE) MCG/ACT IN AERS: 2.0000 | INHALATION_SPRAY | RESPIRATORY_TRACT | Status: DC | PRN

## 2017-04-05 MED ORDER — ALBUTEROL SULFATE (2.5 MG/3ML) 0.083% IN NEBU
5.0000 mg | INHALATION_SOLUTION | Freq: Once | RESPIRATORY_TRACT | Status: AC
  Administered 2017-04-05: 5 mg via RESPIRATORY_TRACT
  Filled 2017-04-05: qty 6

## 2017-04-05 MED ORDER — METHYLPREDNISOLONE SODIUM SUCC 125 MG IJ SOLR
125.0000 mg | Freq: Once | INTRAMUSCULAR | Status: AC
  Administered 2017-04-05: 125 mg via INTRAVENOUS
  Filled 2017-04-05: qty 2

## 2017-04-05 MED ORDER — PREDNISONE 20 MG PO TABS: ORAL_TABLET | ORAL | 0 refills | Status: DC
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
## 2017-04-05 NOTE — ED Notes (Signed)
Pt placed on 4L of O2 as he was at 89% on RA. Pt only increased to 90% on 2L. Pt currently at 94% on 4L of O2.

## 2017-04-05 NOTE — ED Triage Notes (Signed)
Patient comes in with respiratory distress. Gasping for air, struggling to speak full sentences. Pt appears diaphoretic.pt 85% RA placed on [place] but states he cannot tolerate. Placed on non rebreather oxygen saturation up to 100%. Breathing treatment started.

## 2017-04-05 NOTE — ED Notes (Signed)
ED Provider at bedside. 

## 2017-04-26 ENCOUNTER — Ambulatory Visit (HOSPITAL_COMMUNITY)
Admission: EM | Admit: 2017-04-26 | Discharge: 2017-04-26 | Disposition: A | Discharge status: Home / Self Care | Payer: Self-pay | Attending: Family Medicine | Admitting: Family Medicine

## 2017-04-26 ENCOUNTER — Encounter (HOSPITAL_COMMUNITY): Payer: Self-pay | Admitting: Emergency Medicine

## 2017-04-26 DIAGNOSIS — J4541 Moderate persistent asthma with (acute) exacerbation: Secondary | ICD-10-CM

## 2017-04-26 DIAGNOSIS — R062 Wheezing: Secondary | ICD-10-CM

## 2017-04-26 DIAGNOSIS — R0602 Shortness of breath: Secondary | ICD-10-CM

## 2017-04-26 MED ORDER — PREDNISONE 10 MG (21) PO TBPK: ORAL_TABLET | Freq: Every day | ORAL | 0 refills | Status: DC

## 2017-04-26 MED ORDER — ALBUTEROL SULFATE (2.5 MG/3ML) 0.083% IN NEBU
INHALATION_SOLUTION | RESPIRATORY_TRACT | Status: AC
  Filled 2017-04-26: qty 3

## 2017-04-26 MED ORDER — ALBUTEROL SULFATE (2.5 MG/3ML) 0.083% IN NEBU
2.5000 mg | INHALATION_SOLUTION | Freq: Once | RESPIRATORY_TRACT | Status: AC
  Administered 2017-04-26: 2.5 mg via RESPIRATORY_TRACT

## 2017-04-26 MED ORDER — ALBUTEROL SULFATE HFA 108 (90 BASE) MCG/ACT IN AERS: 2.0000 | INHALATION_SPRAY | RESPIRATORY_TRACT | 1 refills | Status: AC | PRN

## 2017-04-26 NOTE — ED Triage Notes (Signed)
Pt reports asthma flare up onset 1645 ... 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 NAD... Ambulatory
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
## 2017-05-09 ENCOUNTER — Other Ambulatory Visit: Payer: Self-pay

## 2017-05-09 ENCOUNTER — Encounter (HOSPITAL_COMMUNITY): Payer: Self-pay | Admitting: Emergency Medicine

## 2017-05-09 ENCOUNTER — Emergency Department (HOSPITAL_COMMUNITY): Payer: Self-pay

## 2017-05-09 DIAGNOSIS — Z5321 Procedure and treatment not carried out due to patient leaving prior to being seen by health care provider: Secondary | ICD-10-CM | POA: Insufficient documentation

## 2017-05-09 DIAGNOSIS — R0602 Shortness of breath: Secondary | ICD-10-CM | POA: Insufficient documentation

## 2017-05-09 MED ORDER — ALBUTEROL SULFATE (2.5 MG/3ML) 0.083% IN NEBU
5.0000 mg | INHALATION_SOLUTION | Freq: Once | RESPIRATORY_TRACT | Status: AC
  Administered 2017-05-09: 5 mg via RESPIRATORY_TRACT

## 2017-05-09 MED ORDER — ALBUTEROL SULFATE (2.5 MG/3ML) 0.083% IN NEBU
INHALATION_SOLUTION | RESPIRATORY_TRACT | Status: AC
  Filled 2017-05-09: qty 6

## 2017-05-09 NOTE — ED Triage Notes (Signed)
Pt presents to ED with a hx of asthma, flare up for 2 days, but today he feels his inhaler stopped working for him.  Pt pauses in sentences to catch his breath.  Pt has not smoked cigarettes since christmas.

## 2017-05-10 ENCOUNTER — Emergency Department (HOSPITAL_COMMUNITY): Payer: Self-pay

## 2017-05-10 ENCOUNTER — Emergency Department (HOSPITAL_COMMUNITY)
Admission: EM | Admit: 2017-05-10 | Discharge: 2017-05-10 | Disposition: A | Discharge status: Home / Self Care | Payer: Self-pay | Attending: Emergency Medicine | Admitting: Emergency Medicine

## 2017-05-10 ENCOUNTER — Encounter (HOSPITAL_COMMUNITY): Payer: Self-pay | Admitting: *Deleted

## 2017-05-10 DIAGNOSIS — F17210 Nicotine dependence, cigarettes, uncomplicated: Secondary | ICD-10-CM | POA: Insufficient documentation

## 2017-05-10 DIAGNOSIS — J069 Acute upper respiratory infection, unspecified: Secondary | ICD-10-CM | POA: Insufficient documentation

## 2017-05-10 DIAGNOSIS — Z79899 Other long term (current) drug therapy: Secondary | ICD-10-CM | POA: Insufficient documentation

## 2017-05-10 DIAGNOSIS — R0989 Other specified symptoms and signs involving the circulatory and respiratory systems: Secondary | ICD-10-CM | POA: Insufficient documentation

## 2017-05-10 DIAGNOSIS — J45901 Unspecified asthma with (acute) exacerbation: Secondary | ICD-10-CM | POA: Insufficient documentation

## 2017-05-10 DIAGNOSIS — R05 Cough: Secondary | ICD-10-CM | POA: Insufficient documentation

## 2017-05-10 LAB — BASIC METABOLIC PANEL
ANION GAP: 8 (ref 5–15)
BUN: 8 mg/dL (ref 6–20)
CO2: 28 mmol/L (ref 22–32)
Calcium: 9.7 mg/dL (ref 8.9–10.3)
Chloride: 103 mmol/L (ref 101–111)
Creatinine, Ser: 1.19 mg/dL (ref 0.61–1.24)
Glucose, Bld: 101 mg/dL — ABNORMAL HIGH (ref 65–99)
Potassium: 4 mmol/L (ref 3.5–5.1)
SODIUM: 139 mmol/L (ref 135–145)

## 2017-05-10 LAB — CBC WITH DIFFERENTIAL/PLATELET
Basophils Absolute: 0 10*3/uL (ref 0.0–0.1)
Basophils Relative: 0 %
Eosinophils Absolute: 0.3 10*3/uL (ref 0.0–0.7)
Eosinophils Relative: 2 %
HCT: 49.2 % (ref 39.0–52.0)
Hemoglobin: 16.2 g/dL (ref 13.0–17.0)
Lymphocytes Relative: 12 %
Lymphs Abs: 2 10*3/uL (ref 0.7–4.0)
MCH: 32.1 pg (ref 26.0–34.0)
MCHC: 32.9 g/dL (ref 30.0–36.0)
MCV: 97.4 fL (ref 78.0–100.0)
Monocytes Absolute: 1.1 10*3/uL — ABNORMAL HIGH (ref 0.1–1.0)
Monocytes Relative: 7 %
Neutro Abs: 12.9 10*3/uL — ABNORMAL HIGH (ref 1.7–7.7)
Neutrophils Relative %: 79 %
Platelets: 287 10*3/uL (ref 150–400)
RBC: 5.05 MIL/uL (ref 4.22–5.81)
RDW: 12.7 % (ref 11.5–15.5)
WBC: 16.3 10*3/uL — ABNORMAL HIGH (ref 4.0–10.5)

## 2017-05-10 MED ORDER — ALBUTEROL SULFATE HFA 108 (90 BASE) MCG/ACT IN AERS
1.0000 | INHALATION_SPRAY | Freq: Once | RESPIRATORY_TRACT | Status: AC
Start: 2017-05-10 — End: 2017-05-10
  Administered 2017-05-10: 1 via RESPIRATORY_TRACT
  Filled 2017-05-10: qty 6.7

## 2017-05-10 MED ORDER — IPRATROPIUM-ALBUTEROL 0.5-2.5 (3) MG/3ML IN SOLN
3.0000 mL | Freq: Once | RESPIRATORY_TRACT | Status: AC
  Administered 2017-05-10: 3 mL via RESPIRATORY_TRACT
  Filled 2017-05-10: qty 3

## 2017-05-10 MED ORDER — AZITHROMYCIN 250 MG PO TABS: 250.0000 mg | ORAL_TABLET | Freq: Every day | ORAL | 0 refills | Status: DC

## 2017-05-10 MED ORDER — PREDNISONE 20 MG PO TABS
60.0000 mg | ORAL_TABLET | Freq: Once | ORAL | Status: AC
  Administered 2017-05-10: 60 mg via ORAL
  Filled 2017-05-10: qty 3

## 2017-05-10 MED ORDER — PREDNISONE 10 MG PO TABS: 40.0000 mg | ORAL_TABLET | Freq: Every day | ORAL | 0 refills | Status: AC

## 2017-05-10 MED ORDER — ALBUTEROL SULFATE (2.5 MG/3ML) 0.083% IN NEBU
5.0000 mg | INHALATION_SOLUTION | Freq: Once | RESPIRATORY_TRACT | Status: AC
  Administered 2017-05-10: 5 mg via RESPIRATORY_TRACT
  Filled 2017-05-10: qty 6

## 2017-05-10 NOTE — ED Triage Notes (Signed)
To ED for eval of sob. States he was in ED last night for same- received breathing tx then fell asleep in lobby, woke at 0430 and was told they took him out of system due to no answer so pt left. Returned with worsening sob. Wheezing noted but decreased breath sounds. Pt doesn't sound like he's moving much air. Unable to hold conversation. Breathing tx started in triage. Skin w/d. Resp labored.

## 2017-05-10 NOTE — ED Notes (Signed)
Pt called for vitals reassessment, no response.  

## 2017-05-10 NOTE — ED Notes (Signed)
Pt ambulated independently in hallway. Pt denied pain or dizziness. Stable gait noted. O2 sat. Stable at 98%-100%. RN notified.

## 2017-05-10 NOTE — ED Notes (Signed)
Pt called multiple times for vitals with no answer

## 2017-05-10 NOTE — ED Notes (Signed)
Pt updated on wait time. Pt laying on bench in NAD. Visitor with pt.

## 2017-05-10 NOTE — Discharge Instructions (Signed)
Take antibiotics as prescribed.  Take the entire course of antibiotics, even if your symptoms improve. Take the entire course of prednisone. Use the inhaler as needed. Is very important that you follow-up with your primary care doctor for further evaluation of your breathing. Return to the emergency room if you develop worsening breathing, chest pain, or any new or concerning symptoms.
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
## 2017-05-10 NOTE — ED Notes (Signed)
No answer for vitals  

## 2017-05-10 NOTE — ED Notes (Signed)
Called pt x2 to re-assess vitals, no answer

## 2017-12-17 ENCOUNTER — Encounter (HOSPITAL_COMMUNITY): Payer: Self-pay | Admitting: Emergency Medicine

## 2017-12-17 ENCOUNTER — Emergency Department (HOSPITAL_COMMUNITY)
Admission: EM | Admit: 2017-12-17 | Discharge: 2017-12-17 | Disposition: A | Discharge status: Home / Self Care | Payer: Self-pay | Attending: Emergency Medicine | Admitting: Emergency Medicine

## 2017-12-17 ENCOUNTER — Emergency Department (HOSPITAL_COMMUNITY): Payer: Self-pay

## 2017-12-17 DIAGNOSIS — F17210 Nicotine dependence, cigarettes, uncomplicated: Secondary | ICD-10-CM | POA: Insufficient documentation

## 2017-12-17 DIAGNOSIS — Z79899 Other long term (current) drug therapy: Secondary | ICD-10-CM | POA: Insufficient documentation

## 2017-12-17 DIAGNOSIS — J4521 Mild intermittent asthma with (acute) exacerbation: Secondary | ICD-10-CM | POA: Insufficient documentation

## 2017-12-17 LAB — BASIC METABOLIC PANEL
ANION GAP: 7 (ref 5–15)
BUN: 6 mg/dL (ref 6–20)
CO2: 31 mmol/L (ref 22–32)
Calcium: 9.6 mg/dL (ref 8.9–10.3)
Chloride: 104 mmol/L (ref 98–111)
Creatinine, Ser: 1.16 mg/dL (ref 0.61–1.24)
GFR calc non Af Amer: >60 mL/min (ref >60)
GLUCOSE: 106 mg/dL — AB (ref 70–99)
POTASSIUM: 4.6 mmol/L (ref 3.5–5.1)
Sodium: 142 mmol/L (ref 135–145)

## 2017-12-17 LAB — CBC WITH DIFFERENTIAL/PLATELET
ABS IMMATURE GRANULOCYTES: 0 10*3/uL (ref 0.0–0.1)
BASOS PCT: 0 %
Basophils Absolute: 0 10*3/uL (ref 0.0–0.1)
Eosinophils Absolute: 0.2 10*3/uL (ref 0.0–0.7)
Eosinophils Relative: 2 %
HCT: 45.5 % (ref 39.0–52.0)
Hemoglobin: 14.4 g/dL (ref 13.0–17.0)
IMMATURE GRANULOCYTES: 0 %
LYMPHS ABS: 1.9 10*3/uL (ref 0.7–4.0)
Lymphocytes Relative: 25 %
MCH: 31.8 pg (ref 26.0–34.0)
MCHC: 31.6 g/dL (ref 30.0–36.0)
MCV: 100.4 fL — ABNORMAL HIGH (ref 78.0–100.0)
MONOS PCT: 8 %
Monocytes Absolute: 0.6 10*3/uL (ref 0.1–1.0)
NEUTROS ABS: 5 10*3/uL (ref 1.7–7.7)
NEUTROS PCT: 65 %
PLATELETS: 293 10*3/uL (ref 150–400)
RBC: 4.53 MIL/uL (ref 4.22–5.81)
RDW: 13.2 % (ref 11.5–15.5)
WBC: 7.7 10*3/uL (ref 4.0–10.5)

## 2017-12-17 MED ORDER — ALBUTEROL SULFATE (2.5 MG/3ML) 0.083% IN NEBU
5.0000 mg | INHALATION_SOLUTION | Freq: Once | RESPIRATORY_TRACT | Status: AC
Start: 2017-12-17 — End: 2017-12-17
  Administered 2017-12-17: 5 mg via RESPIRATORY_TRACT
  Filled 2017-12-17: qty 6

## 2017-12-17 MED ORDER — AZITHROMYCIN 250 MG PO TABS
500.0000 mg | ORAL_TABLET | Freq: Once | ORAL | Status: AC
  Administered 2017-12-17: 500 mg via ORAL
  Filled 2017-12-17: qty 2

## 2017-12-17 MED ORDER — AZITHROMYCIN 250 MG PO TABS: 250.0000 mg | ORAL_TABLET | Freq: Every day | ORAL | 0 refills | Status: AC

## 2017-12-17 MED ORDER — ALBUTEROL SULFATE HFA 108 (90 BASE) MCG/ACT IN AERS
2.0000 | INHALATION_SPRAY | Freq: Once | RESPIRATORY_TRACT | Status: AC
  Administered 2017-12-17: 2 via RESPIRATORY_TRACT
  Filled 2017-12-17: qty 6.7

## 2017-12-17 NOTE — ED Triage Notes (Signed)
Patient reports worsening SOB with wheezing and dry cough onset last night , denies fever or chills , he ran out of his inhaler for asthma .

## 2017-12-17 NOTE — Discharge Instructions (Signed)
You were evaluated in the emergency department for increased shortness of breath and cough.  You had some wheezing here that improved with breathing treatments.  We are sending you home with an albuterol inhaler to use 2 puffs every 4 hours as needed for wheezing.  We are also prescribing you 4 more days of azithromycin to treat a bronchitis.  You should follow-up with your doctor and return if any worsening symptoms.
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
## 2018-01-09 ENCOUNTER — Emergency Department (HOSPITAL_COMMUNITY)
Admission: EM | Admit: 2018-01-09 | Discharge: 2018-01-09 | Disposition: A | Discharge status: Home / Self Care | Payer: Self-pay | Attending: Emergency Medicine | Admitting: Emergency Medicine

## 2018-01-09 ENCOUNTER — Other Ambulatory Visit: Payer: Self-pay

## 2018-01-09 ENCOUNTER — Encounter (HOSPITAL_COMMUNITY): Payer: Self-pay | Admitting: Emergency Medicine

## 2018-01-09 DIAGNOSIS — Z79899 Other long term (current) drug therapy: Secondary | ICD-10-CM | POA: Insufficient documentation

## 2018-01-09 DIAGNOSIS — J4521 Mild intermittent asthma with (acute) exacerbation: Secondary | ICD-10-CM

## 2018-01-09 DIAGNOSIS — F17200 Nicotine dependence, unspecified, uncomplicated: Secondary | ICD-10-CM | POA: Insufficient documentation

## 2018-01-09 MED ORDER — ALBUTEROL SULFATE (2.5 MG/3ML) 0.083% IN NEBU
INHALATION_SOLUTION | RESPIRATORY_TRACT | Status: AC
  Filled 2018-01-09: qty 6

## 2018-01-09 MED ORDER — ALBUTEROL SULFATE (2.5 MG/3ML) 0.083% IN NEBU
5.0000 mg | INHALATION_SOLUTION | Freq: Once | RESPIRATORY_TRACT | Status: AC
  Administered 2018-01-09: 5 mg via RESPIRATORY_TRACT
  Filled 2018-01-09: qty 6

## 2018-01-09 MED ORDER — ALBUTEROL SULFATE (2.5 MG/3ML) 0.083% IN NEBU
5.0000 mg | INHALATION_SOLUTION | Freq: Once | RESPIRATORY_TRACT | Status: AC
  Filled 2018-01-09: qty 6

## 2018-01-09 MED ORDER — PREDNISONE 20 MG PO TABS
60.0000 mg | ORAL_TABLET | Freq: Once | ORAL | Status: AC
  Administered 2018-01-09: 60 mg via ORAL
  Filled 2018-01-09: qty 3

## 2018-01-09 MED ORDER — PREDNISONE 20 MG PO TABS: 60.0000 mg | ORAL_TABLET | Freq: Every day | ORAL | 0 refills | Status: AC

## 2018-01-09 MED ORDER — ALBUTEROL SULFATE HFA 108 (90 BASE) MCG/ACT IN AERS
1.0000 | INHALATION_SPRAY | Freq: Once | RESPIRATORY_TRACT | Status: AC
  Administered 2018-01-09: 2 via RESPIRATORY_TRACT
  Filled 2018-01-09: qty 6.7

## 2018-01-09 NOTE — ED Notes (Signed)
Pt had nebulizer treatment O2 is 100% now, but still has expiratory wheezing
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
## 2018-01-09 NOTE — ED Triage Notes (Signed)
Pt has a history of asthma, was using an inhaler and it did not work. Has been ongoing for a couple of hours

## 2018-02-18 ENCOUNTER — Emergency Department (HOSPITAL_COMMUNITY)
Admission: EM | Admit: 2018-02-18 | Discharge: 2018-02-18 | Discharge status: Against Medical Advice | Payer: Self-pay | Attending: Emergency Medicine | Admitting: Emergency Medicine

## 2018-02-18 ENCOUNTER — Encounter (HOSPITAL_COMMUNITY): Payer: Self-pay | Admitting: Oncology

## 2018-02-18 DIAGNOSIS — J4521 Mild intermittent asthma with (acute) exacerbation: Secondary | ICD-10-CM | POA: Insufficient documentation

## 2018-02-18 DIAGNOSIS — R400 Somnolence: Secondary | ICD-10-CM | POA: Insufficient documentation

## 2018-02-18 DIAGNOSIS — Z5329 Procedure and treatment not carried out because of patient's decision for other reasons: Secondary | ICD-10-CM | POA: Insufficient documentation

## 2018-02-18 DIAGNOSIS — F17210 Nicotine dependence, cigarettes, uncomplicated: Secondary | ICD-10-CM | POA: Insufficient documentation

## 2018-02-18 HISTORY — DX: Hyperlipidemia, unspecified: E78.5

## 2018-02-18 HISTORY — DX: Unspecified asthma, uncomplicated: J45.909

## 2018-02-18 MED ORDER — ALBUTEROL SULFATE HFA 108 (90 BASE) MCG/ACT IN AERS
2.0000 | INHALATION_SPRAY | RESPIRATORY_TRACT | Status: DC | PRN
  Administered 2018-02-18: 2 via RESPIRATORY_TRACT
  Filled 2018-02-18: qty 6.7

## 2018-02-18 NOTE — ED Triage Notes (Signed)
Pt bib [facility] d/t wheezing.  Pt was found outside in the rain.  Unclear how long pt was outside.  Pt's clothing was saturated. EMS reported hearing wheezing in all fields. Pt given 10 mg albuterol and 0.5 mg atrovent en route.

## 2018-02-18 NOTE — ED Provider Notes (Signed)
[facility] EMERGENCY DEPARTMENT Provider Note   CSN: [id number] Arrival date & time: 02/18/18  4098     History   Chief Complaint Chief Complaint  Patient presents with  . Wheezing    HPI Rickey Kelly is a 52 y.o. male.  The history is provided by the patient and the EMS personnel. No language interpreter was used.  Wheezing       52 year old male with history of asthma brought in via EMS for evaluation of shortness of breath.  Per EMS note, patient was found outside in the rain, and clear how long he was outside by his close was saturated.  EMS reports that he was wheezing in all fields and he did receive 10 mg of albuterol and 0.5 mg of Atrovent on route.  Patient is drowsy on my exam, history is limited.  Patient states he had an argument with his girlfriend and she left the house in the rain.  He was trying to find her and felt that the rain may have aggravated his asthma.  He attributed his drowsiness due to taking Xanax that he normally does not take.  Otherwise he denies any alcohol or drug use.  He denies any significant pain.  He report feeling better after receiving breathing treatment.  He does not have any other complaint.  He is requesting for food to eat.  Past Medical History:  Diagnosis Date  . Asthma   . Hyperlipidemia     There are no active problems to display for this patient.   Past Surgical History:  Procedure Laterality Date  . FRACTURE SURGERY          Home Medications    Prior to Admission medications   Not on File    Family History No family history on file.  Social History Social History   Tobacco Use  . Smoking status: Current Every Day Smoker    Packs/day: 1.00    Years: 20.00    Pack years: 20.00    Types: Cigarettes  . Smokeless tobacco: Never Used  Substance Use Topics  . Alcohol use: Not Currently  . Drug use: Not Currently     Allergies   Patient has no known allergies.   Review of  Systems Review of Systems  Respiratory: Positive for wheezing.   All other systems reviewed and are negative.    Physical Exam Updated Vital Signs BP 96/68 (BP Location: Left Arm)   Pulse 83   Temp 98.3 F (36.8 C) (Oral)   Resp (!) 22   Ht 6' (1.829 m)   Wt 87.1 kg   SpO2 96%   BMI 26.04 kg/m   Physical Exam  Constitutional: He appears well-developed and well-nourished. No distress.  Patient is drowsy but easily arousable and in no acute discomfort.  HENT:  Head: Atraumatic.  Mouth/Throat: Oropharynx is clear and moist.  Eyes: Pupils are equal, round, and reactive to light. Conjunctivae and EOM are normal.  Neck: Normal range of motion. Neck supple.  Cardiovascular: Normal rate and regular rhythm.  Pulmonary/Chest: Effort normal and breath sounds normal.  Very faint expiratory wheezes, no rales or rhonchi.  Abdominal: Soft. Bowel sounds are normal. He exhibits no distension. There is no tenderness.  Musculoskeletal: He exhibits no edema.  Neurological: He has normal strength. No cranial nerve deficit or sensory deficit. GCS eye subscore is 4. GCS verbal subscore is 5. GCS motor subscore is 6.  Drowsy sleepy but easily arousable.  Able to move  all 4 extremities.  Skin: No rash noted.  Psychiatric: He has a normal mood and affect.  Nursing note and vitals reviewed.    ED Treatments / Results  Labs (all labs ordered are listed, but only abnormal results are displayed) Labs Reviewed - No data to display  EKG EKG Interpretation  Date/Time:  Sunday February 18 2018 06:45:35 EDT Ventricular Rate:  70 PR Interval:    QRS Duration: 90 QT Interval:  364 QTC Calculation: 393 R Axis:   55 Text Interpretation:  Sinus rhythm Atrial premature complexes in couplets Confirmed by Tilden Fossa [phone]) on 02/18/2018 7:02:15 AM   Radiology No results found.  Procedures Procedures (including critical care time)  Medications Ordered in ED Medications - No data to  display   Initial Impression / Assessment and Plan / ED Course  I have reviewed the triage vital signs and the nursing notes.  Pertinent labs & imaging results that were available during my care of the patient were reviewed by me and considered in my medical decision making (see chart for details).     BP 96/68 (BP Location: Left Arm)   Pulse 83   Temp 98.3 F (36.8 C) (Oral)   Resp (!) 22   Ht 6' (1.829 m)   Wt 87.1 kg   SpO2 96%   BMI 26.04 kg/m    Final Clinical Impressions(s) / ED Diagnoses   Final diagnoses:  Mild intermittent asthma with exacerbation    ED Discharge Orders    None     7:28 AM Patient here with an apparent asthma exacerbation after being out in the rain for a prolonged period of time last night.  He received duo nebs prior to arrival and currently he has minimal wheezes on exam.  He is in no acute respiratory discomfort.  He has however appears very drowsy and attributed to taking a Xanax last night.  He is requesting food to eat.  8:39 AM Patient ambulate while maintaining adequate oxygenation.  Patient however eloped prior to discharge.  He otherwise stable for discharge.   Fayrene Helper, PA-C 02/18/18 0840    Tilden Fossa, MD 02/18/18 [phone]

## 2018-02-18 NOTE — ED Notes (Addendum)
pt ambulated in the hallway with no respiratory issues or complaints. O2 was at 98%.

## 2018-02-18 NOTE — ED Notes (Signed)
Patient getting verbally aggressive with staff. Security at bedside. Patient refusing to let staff repeat vital signs. States his wife is here and he is leaving. Patient informed of needing discharge instructions. Patient continued to walk to lobby in no respiratory distress. PA notified.

## 2018-02-20 ENCOUNTER — Encounter (HOSPITAL_COMMUNITY): Payer: Self-pay | Admitting: Emergency Medicine

## 2019-01-02 ENCOUNTER — Encounter (HOSPITAL_COMMUNITY): Payer: Self-pay | Admitting: Emergency Medicine

## 2019-01-02 DIAGNOSIS — Z5321 Procedure and treatment not carried out due to patient leaving prior to being seen by health care provider: Secondary | ICD-10-CM | POA: Insufficient documentation

## 2019-01-02 LAB — CBC WITH DIFFERENTIAL/PLATELET
Abs Immature Granulocytes: 0.04 10*3/uL (ref 0.00–0.07)
Basophils Absolute: 0 10*3/uL (ref 0.0–0.1)
Basophils Relative: 0 %
Eosinophils Absolute: 0.1 10*3/uL (ref 0.0–0.5)
Eosinophils Relative: 0 %
HCT: 42.5 % (ref 39.0–52.0)
Hemoglobin: 13.8 g/dL (ref 13.0–17.0)
Immature Granulocytes: 0 %
Lymphocytes Relative: 9 %
Lymphs Abs: 1.1 10*3/uL (ref 0.7–4.0)
MCH: 32.6 pg (ref 26.0–34.0)
MCHC: 32.5 g/dL (ref 30.0–36.0)
MCV: 100.5 fL — ABNORMAL HIGH (ref 80.0–100.0)
Monocytes Absolute: 0.8 10*3/uL (ref 0.1–1.0)
Monocytes Relative: 7 %
Neutro Abs: 9.6 10*3/uL — ABNORMAL HIGH (ref 1.7–7.7)
Neutrophils Relative %: 84 %
Platelets: 323 10*3/uL (ref 150–400)
RBC: 4.23 MIL/uL (ref 4.22–5.81)
RDW: 12.6 % (ref 11.5–15.5)
WBC: 11.5 10*3/uL — ABNORMAL HIGH (ref 4.0–10.5)
nRBC: 0 % (ref 0.0–0.2)

## 2019-01-02 LAB — COMPREHENSIVE METABOLIC PANEL
ALT: 31 U/L (ref 0–44)
AST: 38 U/L (ref 15–41)
Albumin: 4.6 g/dL (ref 3.5–5.0)
Alkaline Phosphatase: 91 U/L (ref 38–126)
Anion gap: 8 (ref 5–15)
BUN: 25 mg/dL — ABNORMAL HIGH (ref 6–20)
CO2: 26 mmol/L (ref 22–32)
Calcium: 9.8 mg/dL (ref 8.9–10.3)
Chloride: 107 mmol/L (ref 98–111)
Creatinine, Ser: 1.51 mg/dL — ABNORMAL HIGH (ref 0.61–1.24)
GFR calc Af Amer: >60 mL/min (ref >60)
GFR calc non Af Amer: 53 mL/min — ABNORMAL LOW (ref >60)
Glucose, Bld: 116 mg/dL — ABNORMAL HIGH (ref 70–99)
Potassium: 3.7 mmol/L (ref 3.5–5.1)
Sodium: 141 mmol/L (ref 135–145)
Total Bilirubin: 0.7 mg/dL (ref 0.3–1.2)
Total Protein: 8.2 g/dL — ABNORMAL HIGH (ref 6.5–8.1)

## 2019-01-02 NOTE — ED Triage Notes (Signed)
Patient here from home with complaints of bilateral calf muscle cramps that started this morning. Ambulatory.

## 2019-01-03 ENCOUNTER — Emergency Department (HOSPITAL_COMMUNITY)
Admission: EM | Admit: 2019-01-03 | Discharge: 2019-01-03 | Discharge status: Against Medical Advice | Payer: Self-pay | Attending: Emergency Medicine | Admitting: Emergency Medicine

## 2019-01-03 NOTE — ED Notes (Signed)
Pt not in lobby.  

## 2019-01-15 IMAGING — DX DG CHEST 2V
2 series · 2 of 2 positions shown · non-contrast
Comparison: 05/10/2017

CLINICAL DATA: Shortness of breath and dry cough.  Asthma.

EXAM:
CHEST - 2 VIEW

[chest pa]
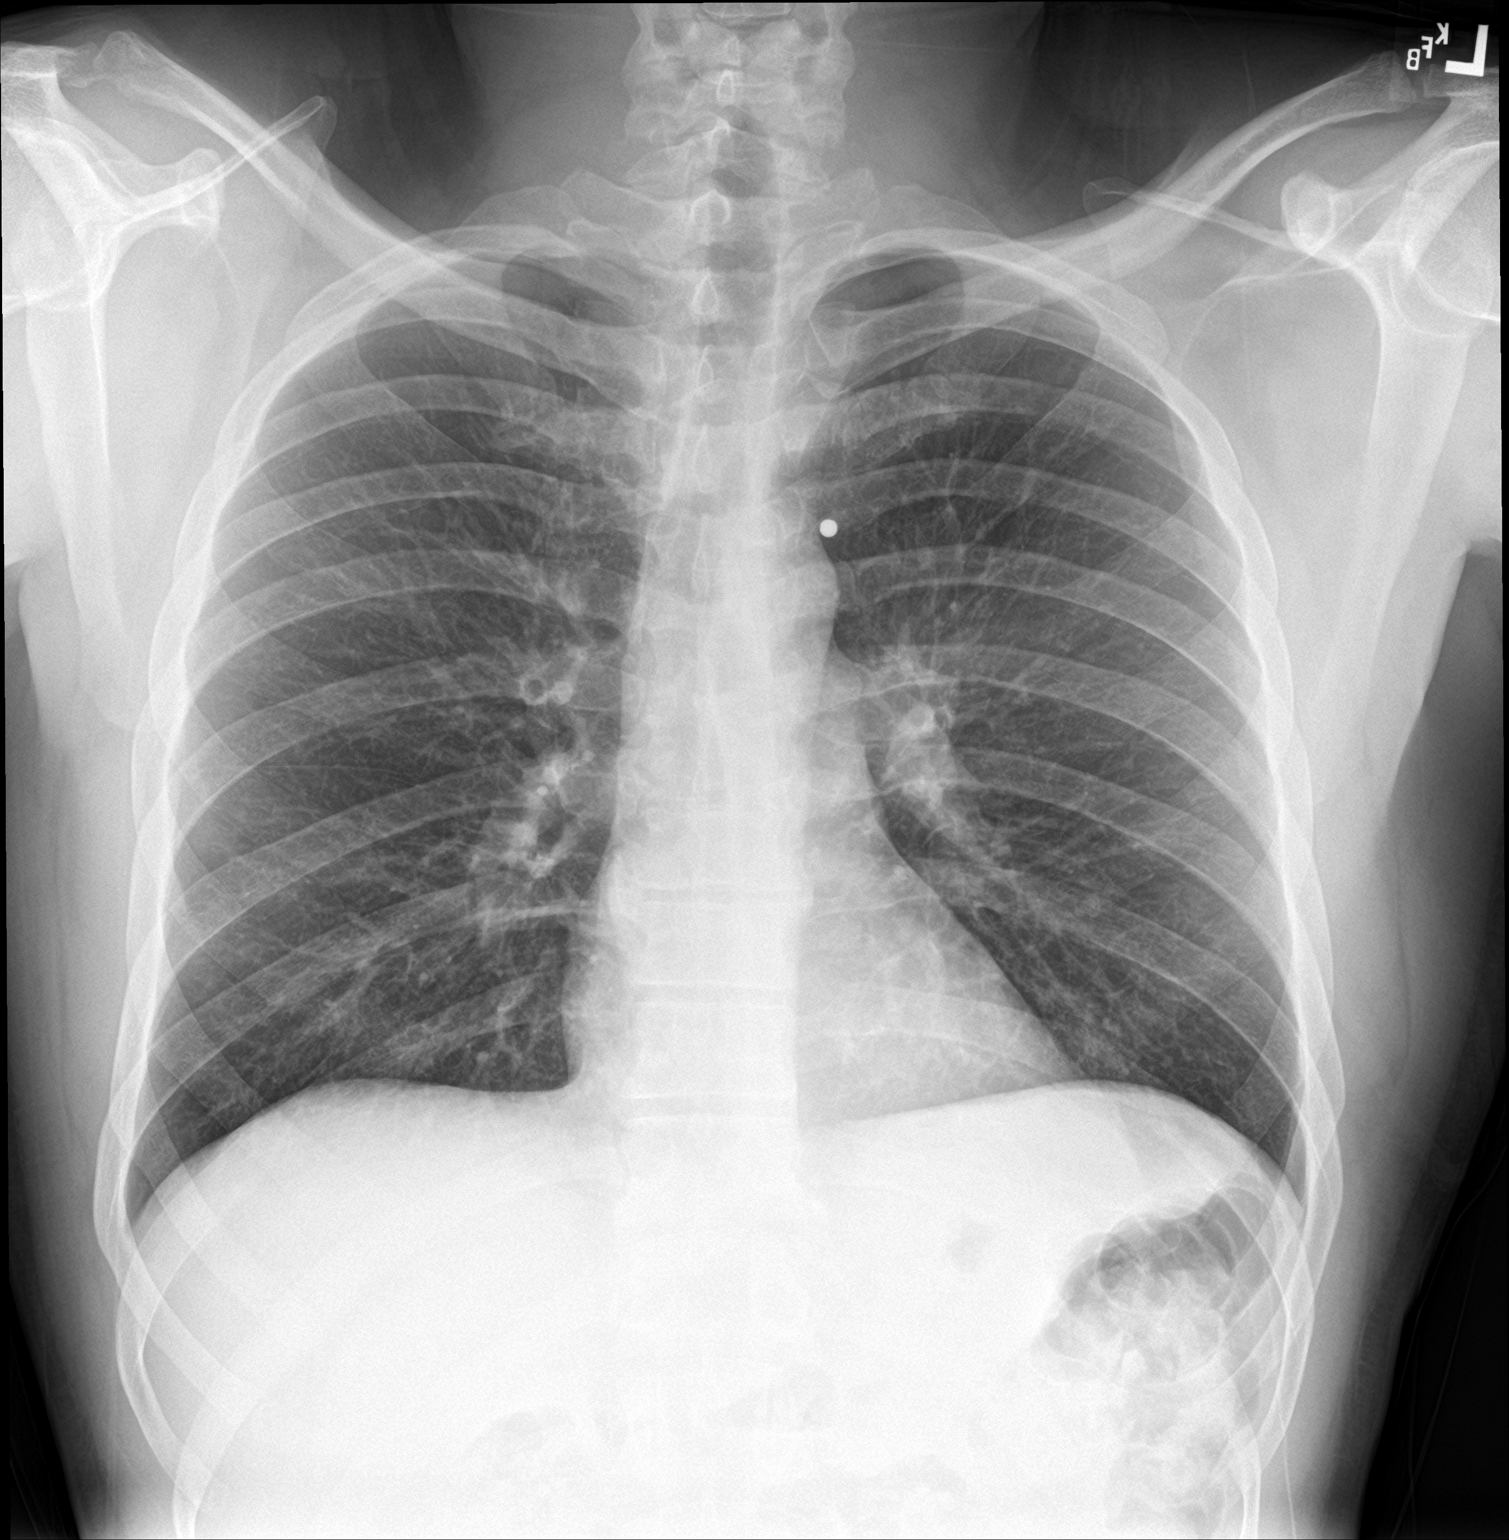

[chest lat]
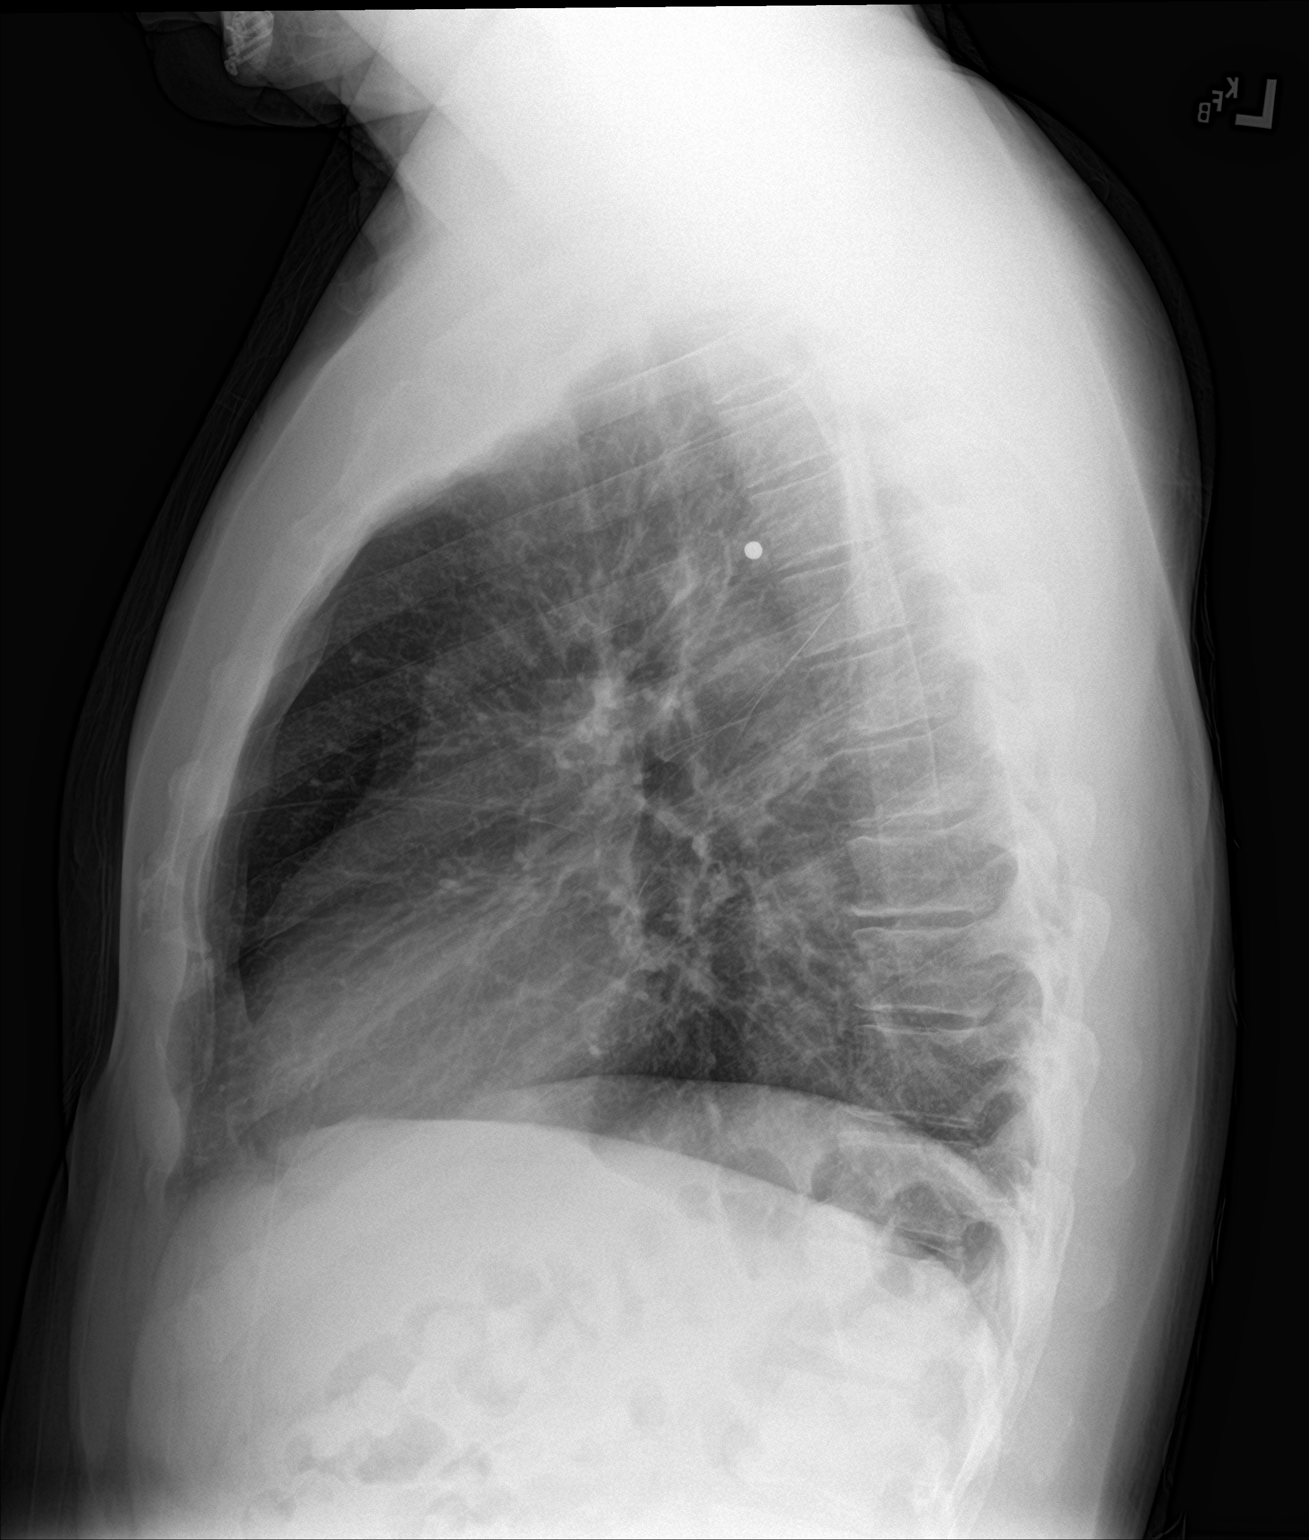

[2 of 2 positions shown; findings below may reference images not displayed]

FINDINGS: Midline trachea. Normal heart size and mediastinal contours. No
pleural effusion or pneumothorax. Diffuse peribronchial thickening.
Clear lungs. Metallic foreign body again projects in the central
left upper lobe.
IMPRESSION: 1. No acute cardiopulmonary disease.
2. Mild peribronchial thickening which may relate to chronic
bronchitis or smoking.
